# Patient Record
Sex: Female | Born: 1960 | Race: Black or African American | Hispanic: No | Smoking: Never smoker
Health system: Southern US, Community
[De-identification: ages and names within clinical notes are randomized; demographics above are authoritative.]

## PROBLEM LIST (undated history)

## (undated) DIAGNOSIS — I10 Essential (primary) hypertension: Secondary | ICD-10-CM

## (undated) DIAGNOSIS — R7303 Prediabetes: Secondary | ICD-10-CM

## (undated) HISTORY — PX: LAPAROSCOPIC TUBAL LIGATION: SUR803

## (undated) HISTORY — DX: Essential (primary) hypertension: I10

---

## 2018-12-19 ENCOUNTER — Encounter: Payer: Self-pay | Admitting: Radiology

## 2018-12-25 ENCOUNTER — Encounter: Payer: Self-pay | Admitting: Obstetrics & Gynecology

## 2018-12-25 ENCOUNTER — Ambulatory Visit (INDEPENDENT_AMBULATORY_CARE_PROVIDER_SITE_OTHER): Payer: BLUE CROSS/BLUE SHIELD | Admitting: Obstetrics & Gynecology

## 2018-12-25 ENCOUNTER — Encounter: Payer: Self-pay | Admitting: Radiology

## 2018-12-25 VITALS — BP 141/89 | HR 72 | Wt 168.2 lb

## 2018-12-25 DIAGNOSIS — Z1151 Encounter for screening for human papillomavirus (HPV): Secondary | ICD-10-CM | POA: Diagnosis not present

## 2018-12-25 DIAGNOSIS — Z01419 Encounter for gynecological examination (general) (routine) without abnormal findings: Secondary | ICD-10-CM | POA: Diagnosis not present

## 2018-12-25 DIAGNOSIS — Z124 Encounter for screening for malignant neoplasm of cervix: Secondary | ICD-10-CM | POA: Diagnosis not present

## 2018-12-25 DIAGNOSIS — Z1231 Encounter for screening mammogram for malignant neoplasm of breast: Secondary | ICD-10-CM

## 2018-12-25 NOTE — Progress Notes (Signed)
GYNECOLOGY ANNUAL PREVENTATIVE CARE ENCOUNTER NOTE  Subjective:   Caitlyn Shannon is a 58 y.o.  [phone] PMP female here for a routine annual gynecologic exam and to establish care.  Current complaints: none.   Denies abnormal vaginal bleeding, discharge, pelvic pain, problems with intercourse or other gynecologic concerns.    Gynecologic History No LMP recorded. Patient is postmenopausal. Contraception: post menopausal status Last Pap: 2017. Results were: normal with negative HPV Last mammogram: 2018. Results were: normal  Obstetric History OB History  Gravida Para Term Preterm AB Living  5 2 2   3 2   SAB TAB Ectopic Multiple Live Births  2 1          # Outcome Date GA Lbr Len/2nd Weight Sex Delivery Anes PTL Lv  5 TAB           4 SAB           3 SAB           2 Term      VBAC     1 Term      CS-LTranv       Past Medical History:  Diagnosis Date  . Hypertension     Past Surgical History:  Procedure Laterality Date  . CESAREAN SECTION    . LAPAROSCOPIC TUBAL LIGATION      Current Outpatient Medications on File Prior to Visit  Medication Sig Dispense Refill  . aspirin EC 81 MG tablet Take by mouth.    . spironolactone (ALDACTONE) 25 MG tablet Take by mouth.    . chlorthalidone (HYGROTON) 25 MG tablet Take by mouth.     No current facility-administered medications on file prior to visit.     Allergies  Allergen Reactions  . Sulfamethoxazole-Trimethoprim Rash    Social History:  reports that she has never smoked. She has never used smokeless tobacco. She reports current alcohol use. She reports that she does not use drugs.  History reviewed. No pertinent family history.  The following portions of the patient's history were reviewed and updated as appropriate: allergies, current medications, past family history, past medical history, past social history, past surgical history and problem list.  Review of Systems Pertinent items noted in HPI and remainder of  comprehensive ROS otherwise negative.   Objective:  BP (!) 141/89   Pulse 72   Wt 168 lb 3.2 oz (76.3 kg)  CONSTITUTIONAL: Well-developed, well-nourished female in no acute distress.  HENT:  Normocephalic, atraumatic, External right and left ear normal. Oropharynx is clear and moist EYES: Conjunctivae and EOM are normal. Pupils are equal, round, and reactive to light. No scleral icterus.  NECK: Normal range of motion, supple, no masses.  Normal thyroid.  SKIN: Skin is warm and dry. No rash noted. Not diaphoretic. No erythema. No pallor. MUSCULOSKELETAL: Normal range of motion. No tenderness.  No cyanosis, clubbing, or edema.  2+ distal pulses. NEUROLOGIC: Alert and oriented to person, place, and time. Normal reflexes, muscle tone coordination. No cranial nerve deficit noted. PSYCHIATRIC: Normal mood and affect. Normal behavior. Normal judgment and thought content. CARDIOVASCULAR: Normal heart rate noted, regular rhythm RESPIRATORY: Clear to auscultation bilaterally. Effort and breath sounds normal, no problems with respiration noted. BREASTS: Symmetric in size. No masses, skin changes, nipple drainage, or lymphadenopathy. ABDOMEN: Soft, normal bowel sounds, no distention noted.  No tenderness, rebound or guarding.  PELVIC: Normal appearing external genitalia; normal appearing vaginal mucosa and cervix.  No abnormal discharge noted.  Pap smear obtained.  Normal uterine size, no other palpable masses, no uterine or adnexal tenderness.    Assessment and Plan:  1. Breast cancer screening by mammogram Mammogram scheduled - MM 3D SCREEN BREAST BILATERAL; Future  2. Encounter for gynecological examination with Papanicolaou smear of cervix - Cytology - PAP( [place]) Will follow up results of pap smear and manage accordingly. Routine preventative health maintenance measures emphasized. Please refer to After Visit Summary for other counseling recommendations.    Jaynie Collins, MD,  FACOG Obstetrician & Gynecologist, [facility] for [employer], [facility]

## 2018-12-25 NOTE — Patient Instructions (Signed)
Preventive Care 40-64 Years, Female Preventive care refers to lifestyle choices and visits with your health care provider that can promote health and wellness. What does preventive care include?   A yearly physical exam. This is also called an annual well check.  Dental exams once or twice a year.  Routine eye exams. Ask your health care provider how often you should have your eyes checked.  Personal lifestyle choices, including: ? Daily care of your teeth and gums. ? Regular physical activity. ? Eating a healthy diet. ? Avoiding tobacco and drug use. ? Limiting alcohol use. ? Practicing safe sex. ? Taking low-dose aspirin daily starting at age 50. ? Taking vitamin and mineral supplements as recommended by your health care provider. What happens during an annual well check? The services and screenings done by your health care provider during your annual well check will depend on your age, overall health, lifestyle risk factors, and family history of disease. Counseling Your health care provider may ask you questions about your:  Alcohol use.  Tobacco use.  Drug use.  Emotional well-being.  Home and relationship well-being.  Sexual activity.  Eating habits.  Work and work environment.  Method of birth control.  Menstrual cycle.  Pregnancy history. Screening You may have the following tests or measurements:  Height, weight, and BMI.  Blood pressure.  Lipid and cholesterol levels. These may be checked every 5 years, or more frequently if you are over 50 years old.  Skin check.  Lung cancer screening. You may have this screening every year starting at age 55 if you have a 30-pack-year history of smoking and currently smoke or have quit within the past 15 years.  Colorectal cancer screening. All adults should have this screening starting at age 50 and continuing until age 75. Your health care provider may recommend screening at age 45. You will have tests every  1-10 years, depending on your results and the type of screening test. People at increased risk should start screening at an earlier age. Screening tests may include: ? Guaiac-based fecal occult blood testing. ? Fecal immunochemical test (FIT). ? Stool DNA test. ? Virtual colonoscopy. ? Sigmoidoscopy. During this test, a flexible tube with a tiny camera (sigmoidoscope) is used to examine your rectum and lower colon. The sigmoidoscope is inserted through your anus into your rectum and lower colon. ? Colonoscopy. During this test, a long, thin, flexible tube with a tiny camera (colonoscope) is used to examine your entire colon and rectum.  Hepatitis C blood test.  Hepatitis B blood test.  Sexually transmitted disease (STD) testing.  Diabetes screening. This is done by checking your blood sugar (glucose) after you have not eaten for a while (fasting). You may have this done every 1-3 years.  Mammogram. This may be done every 1-2 years. Talk to your health care provider about when you should start having regular mammograms. This may depend on whether you have a family history of breast cancer.  BRCA-related cancer screening. This may be done if you have a family history of breast, ovarian, tubal, or peritoneal cancers.  Pelvic exam and Pap test. This may be done every 3 years starting at age 21. Starting at age 30, this may be done every 5 years if you have a Pap test in combination with an HPV test.  Bone density scan. This is done to screen for osteoporosis. You may have this scan if you are at high risk for osteoporosis. Discuss your test results, treatment options,   and if necessary, the need for more tests with your health care provider. Vaccines Your health care provider may recommend certain vaccines, such as:  Influenza vaccine. This is recommended every year.  Tetanus, diphtheria, and acellular pertussis (Tdap, Td) vaccine. You may need a Td booster every 10 years.  Varicella  vaccine. You may need this if you have not been vaccinated.  Zoster vaccine. You may need this after age 38.  Measles, mumps, and rubella (MMR) vaccine. You may need at least one dose of MMR if you were born in 1957 or later. You may also need a second dose.  Pneumococcal 13-valent conjugate (PCV13) vaccine. You may need this if you have certain conditions and were not previously vaccinated.  Pneumococcal polysaccharide (PPSV23) vaccine. You may need one or two doses if you smoke cigarettes or if you have certain conditions.  Meningococcal vaccine. You may need this if you have certain conditions.  Hepatitis A vaccine. You may need this if you have certain conditions or if you travel or work in places where you may be exposed to hepatitis A.  Hepatitis B vaccine. You may need this if you have certain conditions or if you travel or work in places where you may be exposed to hepatitis B.  Haemophilus influenzae type b (Hib) vaccine. You may need this if you have certain conditions. Talk to your health care provider about which screenings and vaccines you need and how often you need them. This information is not intended to replace advice given to you by your health care provider. Make sure you discuss any questions you have with your health care provider. Document Released: 12/09/2015 Document Revised: 01/02/2018 Document Reviewed: 09/13/2015 Elsevier Interactive Patient Education  2019 [employer].

## 2018-12-29 LAB — CYTOLOGY - PAP
Diagnosis: NEGATIVE
HPV: NOT DETECTED

## 2019-01-07 ENCOUNTER — Ambulatory Visit
Admission: RE | Admit: 2019-01-07 | Discharge: 2019-01-07 | Disposition: A | Discharge status: Home / Self Care | Payer: BLUE CROSS/BLUE SHIELD | Source: Ambulatory Visit | Attending: Obstetrics & Gynecology | Admitting: Obstetrics & Gynecology

## 2019-01-07 DIAGNOSIS — Z1231 Encounter for screening mammogram for malignant neoplasm of breast: Secondary | ICD-10-CM | POA: Diagnosis not present

## 2019-07-31 IMAGING — MG DIGITAL SCREENING BILATERAL MAMMOGRAM WITH TOMO AND CAD
8 series · 8 of 24 positions shown · non-contrast
Comparison: Previous exam(s).

CLINICAL DATA: Screening.

EXAM:
DIGITAL SCREENING BILATERAL MAMMOGRAM WITH TOMO AND CAD

[L CC synth-2D]
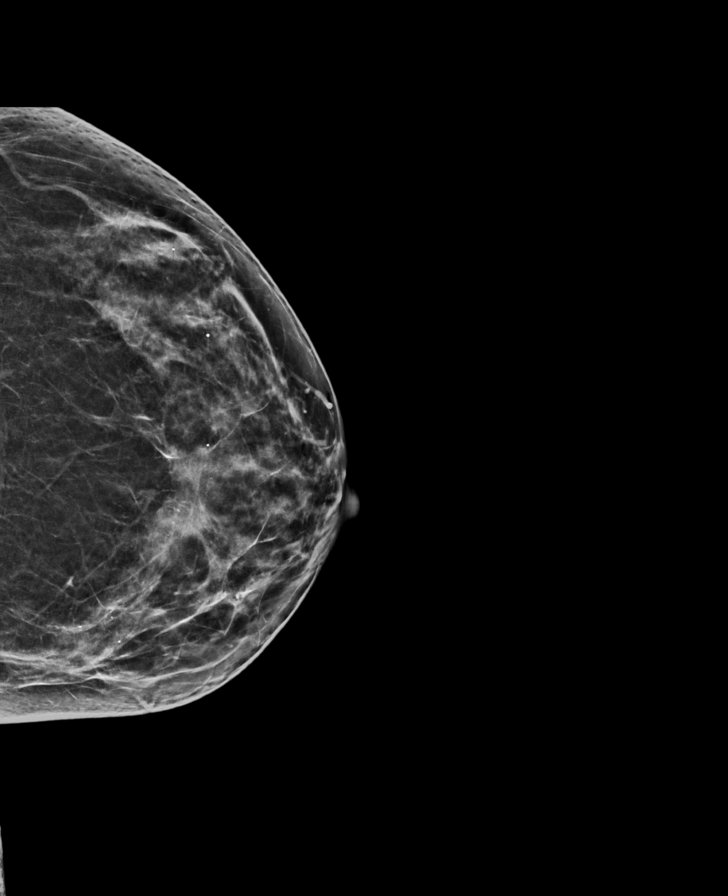

[R CC synth-2D]
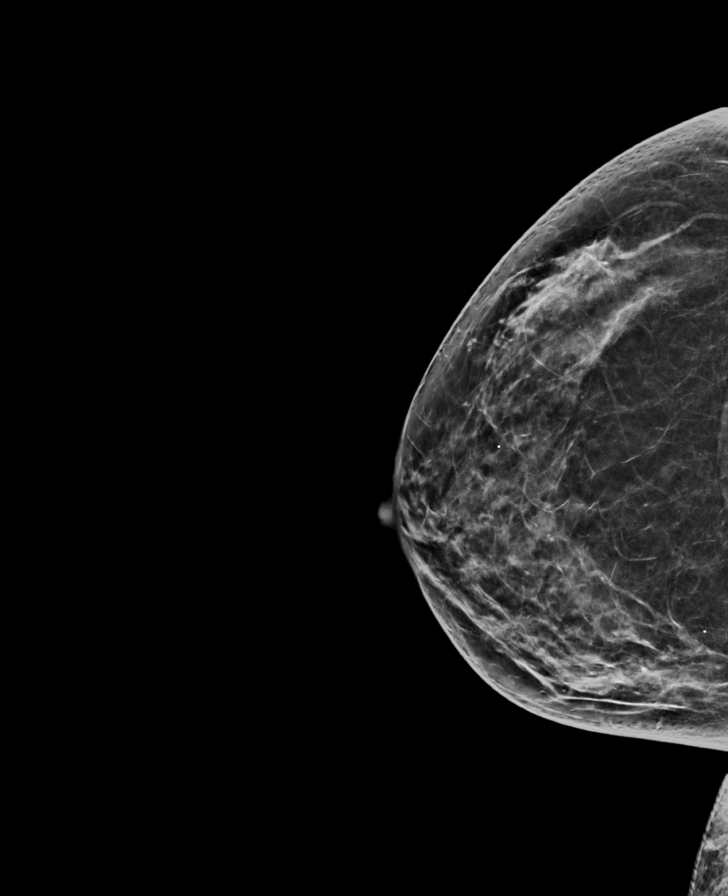

[R MLO synth-2D]
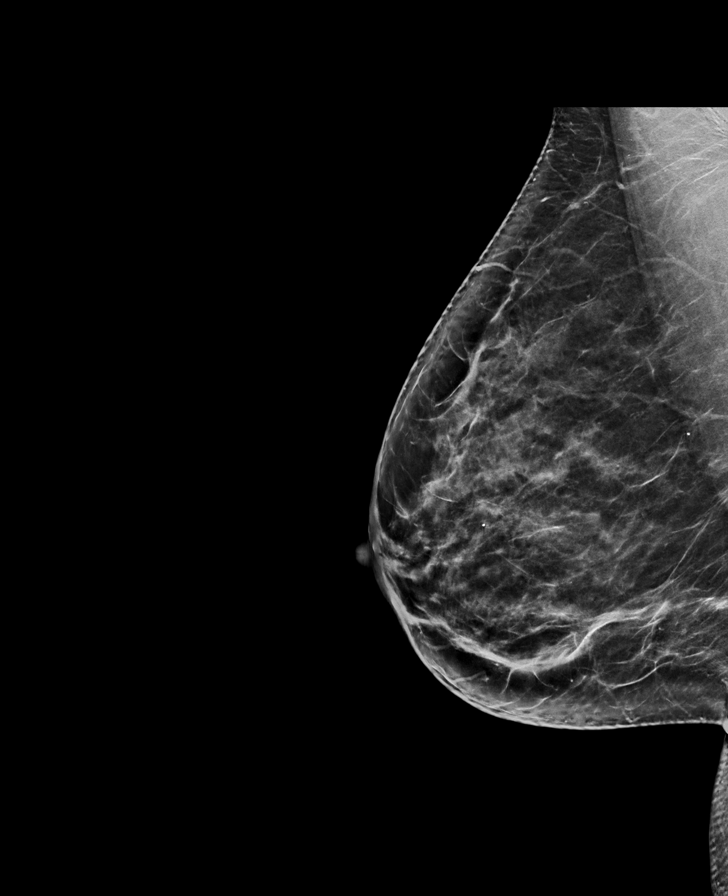

[L MLO synth-2D]
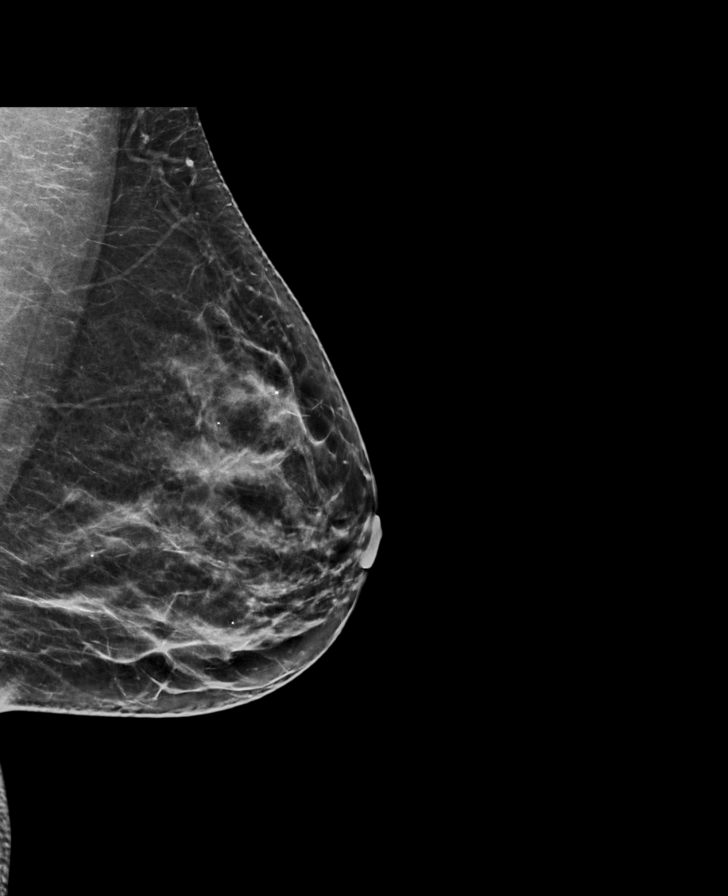

[R MLO tomo · tomo slice 37/72.0]
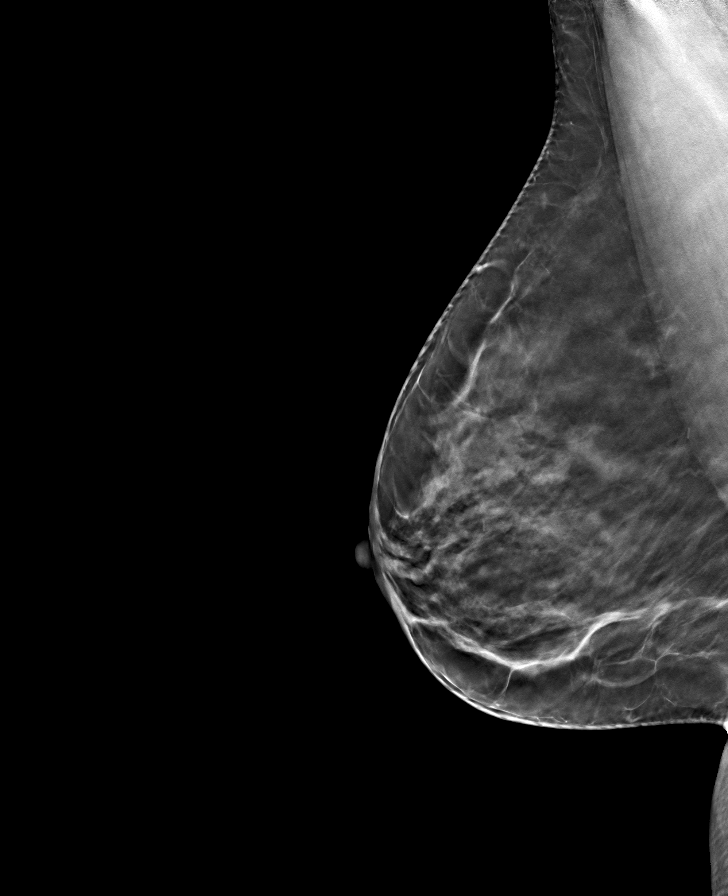

[L MLO tomo · tomo slice 37/73.0]
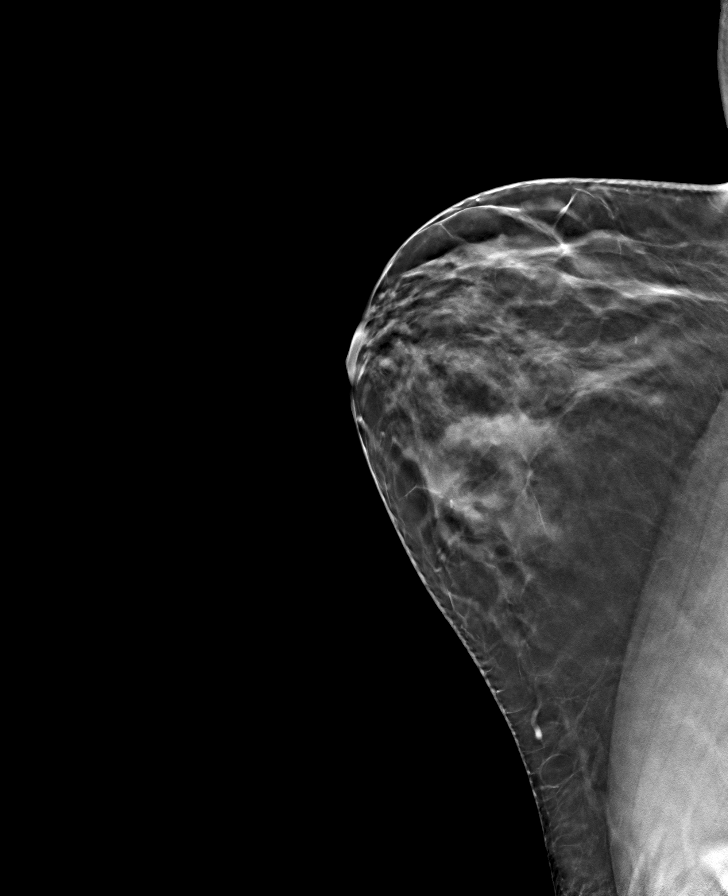

[L CC tomo · tomo slice 33/66.0]
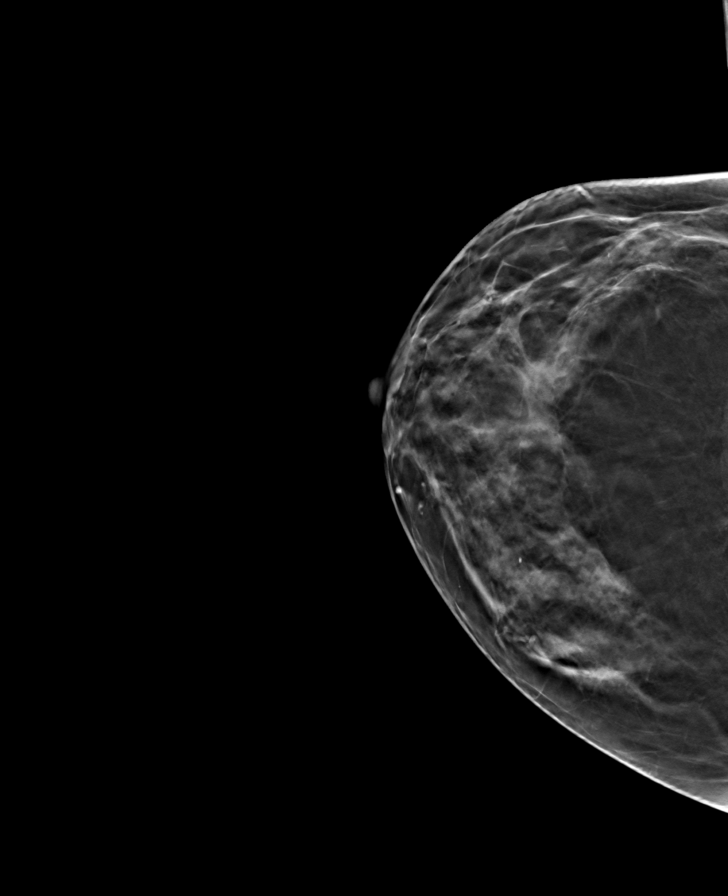

[R CC tomo · tomo slice 33/66.0]
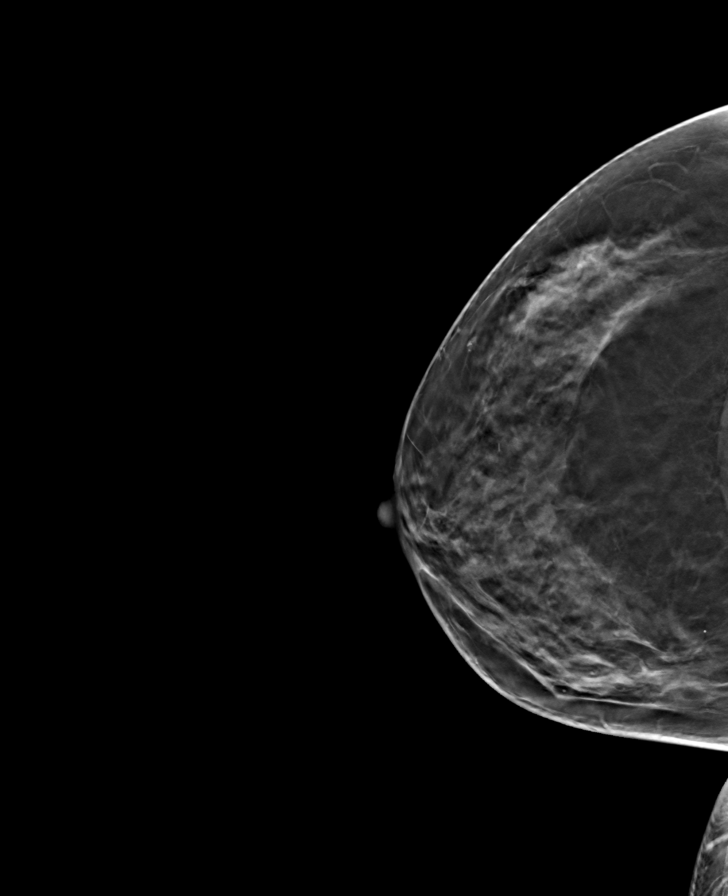

[8 of 24 positions shown; findings below may reference images not displayed]

ACR Breast Density Category c: The breast tissue is heterogeneously
dense, which may obscure small masses.
FINDINGS: There are no findings suspicious for malignancy. Images were
processed with CAD.
IMPRESSION: No mammographic evidence of malignancy. A result letter of this
screening mammogram will be mailed directly to the patient.

RECOMMENDATION:
Screening mammogram in one year. (Code:[id number])

BI-RADS CATEGORY  1: Negative.

## 2019-11-23 ENCOUNTER — Encounter: Payer: Self-pay | Admitting: Radiology

## 2020-01-27 ENCOUNTER — Other Ambulatory Visit: Payer: Self-pay | Admitting: Family Medicine

## 2020-01-27 DIAGNOSIS — Z1231 Encounter for screening mammogram for malignant neoplasm of breast: Secondary | ICD-10-CM

## 2020-02-17 ENCOUNTER — Ambulatory Visit
Admission: RE | Admit: 2020-02-17 | Discharge: 2020-02-17 | Disposition: A | Discharge status: Home / Self Care | Payer: BC Managed Care – PPO | Source: Ambulatory Visit | Attending: Family Medicine | Admitting: Family Medicine

## 2020-02-17 DIAGNOSIS — Z1231 Encounter for screening mammogram for malignant neoplasm of breast: Secondary | ICD-10-CM | POA: Insufficient documentation

## 2021-03-06 ENCOUNTER — Other Ambulatory Visit: Payer: Self-pay | Admitting: Family Medicine

## 2021-03-06 DIAGNOSIS — Z1231 Encounter for screening mammogram for malignant neoplasm of breast: Secondary | ICD-10-CM

## 2021-03-20 ENCOUNTER — Ambulatory Visit
Admission: RE | Admit: 2021-03-20 | Discharge: 2021-03-20 | Disposition: A | Discharge status: Home / Self Care | Payer: BC Managed Care – PPO | Source: Ambulatory Visit | Attending: Family Medicine | Admitting: Family Medicine

## 2021-03-20 ENCOUNTER — Other Ambulatory Visit: Payer: Self-pay

## 2021-03-20 DIAGNOSIS — Z1231 Encounter for screening mammogram for malignant neoplasm of breast: Secondary | ICD-10-CM | POA: Insufficient documentation

## 2022-03-09 ENCOUNTER — Other Ambulatory Visit: Payer: Self-pay | Admitting: Family Medicine

## 2022-03-09 DIAGNOSIS — Z1231 Encounter for screening mammogram for malignant neoplasm of breast: Secondary | ICD-10-CM

## 2022-04-13 ENCOUNTER — Ambulatory Visit
Admission: RE | Admit: 2022-04-13 | Discharge: 2022-04-13 | Disposition: A | Discharge status: Home / Self Care | Payer: BC Managed Care – PPO | Source: Ambulatory Visit | Attending: Family Medicine | Admitting: Family Medicine

## 2022-04-13 DIAGNOSIS — Z1231 Encounter for screening mammogram for malignant neoplasm of breast: Secondary | ICD-10-CM | POA: Insufficient documentation

## 2022-04-25 ENCOUNTER — Other Ambulatory Visit: Payer: Self-pay | Admitting: Family Medicine

## 2022-04-25 DIAGNOSIS — R1030 Lower abdominal pain, unspecified: Secondary | ICD-10-CM

## 2022-04-25 DIAGNOSIS — R109 Unspecified abdominal pain: Secondary | ICD-10-CM

## 2022-05-03 ENCOUNTER — Ambulatory Visit: Payer: BC Managed Care – PPO

## 2023-04-01 ENCOUNTER — Other Ambulatory Visit: Payer: Self-pay | Admitting: Family Medicine

## 2023-04-01 DIAGNOSIS — Z1231 Encounter for screening mammogram for malignant neoplasm of breast: Secondary | ICD-10-CM

## 2023-04-16 ENCOUNTER — Other Ambulatory Visit: Payer: Self-pay | Admitting: Family Medicine

## 2023-04-16 DIAGNOSIS — N83201 Unspecified ovarian cyst, right side: Secondary | ICD-10-CM

## 2023-04-19 ENCOUNTER — Ambulatory Visit: Payer: BC Managed Care – PPO

## 2023-04-23 ENCOUNTER — Ambulatory Visit
Admission: RE | Admit: 2023-04-23 | Discharge: 2023-04-23 | Disposition: A | Discharge status: Home / Self Care | Payer: BC Managed Care – PPO | Source: Ambulatory Visit | Attending: Family Medicine | Admitting: Family Medicine

## 2023-04-23 DIAGNOSIS — Z1231 Encounter for screening mammogram for malignant neoplasm of breast: Secondary | ICD-10-CM | POA: Diagnosis present

## 2024-04-30 ENCOUNTER — Other Ambulatory Visit: Payer: Self-pay | Admitting: Family Medicine

## 2024-04-30 DIAGNOSIS — Z1231 Encounter for screening mammogram for malignant neoplasm of breast: Secondary | ICD-10-CM

## 2024-05-13 ENCOUNTER — Ambulatory Visit
Admission: RE | Admit: 2024-05-13 | Discharge: 2024-05-13 | Disposition: A | Discharge status: Home / Self Care | Source: Ambulatory Visit | Attending: Family Medicine | Admitting: Family Medicine

## 2024-05-13 DIAGNOSIS — Z1231 Encounter for screening mammogram for malignant neoplasm of breast: Secondary | ICD-10-CM | POA: Insufficient documentation

## 2024-10-01 ENCOUNTER — Other Ambulatory Visit: Payer: Self-pay | Admitting: Podiatry

## 2024-10-12 ENCOUNTER — Encounter: Payer: Self-pay | Admitting: Podiatry

## 2024-10-12 NOTE — Anesthesia Preprocedure Evaluation (Signed)
 Anesthesia Evaluation  Patient identified by MRN, date of birth, ID band Patient awake    Reviewed: Allergy & Precautions, H&P , NPO status , Patient's Chart, lab work & pertinent test results  Airway Mallampati: III       Dental  (+) Caps Permanent bridge, caps:   Pulmonary neg pulmonary ROS          Cardiovascular hypertension, negative cardio ROS      Neuro/Psych negative neurological ROS  negative psych ROS   GI/Hepatic negative GI ROS, Neg liver ROS,,,  Endo/Other  negative endocrine ROS    Renal/GU negative Renal ROS  negative genitourinary   Musculoskeletal negative musculoskeletal ROS (+)    Abdominal   Peds negative pediatric ROS (+)  Hematology negative hematology ROS (+)   Anesthesia Other Findings HTN Pre-diabetes   Reproductive/Obstetrics negative OB ROS                              Anesthesia Physical Anesthesia Plan  ASA: 2  Anesthesia Plan: General   Post-op Pain Management:    Induction: Intravenous  PONV Risk Score and Plan:   Airway Management Planned: Natural Airway and Nasal Cannula  Additional Equipment:   Intra-op Plan:   Post-operative Plan: Extubation in OR  Informed Consent: I have reviewed the patients History and Physical, chart, labs and discussed the procedure including the risks, benefits and alternatives for the proposed anesthesia with the patient or authorized representative who has indicated his/her understanding and acceptance.     Dental Advisory Given  Plan Discussed with: Anesthesiologist, CRNA and Surgeon  Anesthesia Plan Comments: (Patient consented for risks of anesthesia including but not limited to:  - adverse reactions to medications - risk of airway placement if required - damage to eyes, teeth, lips or other oral mucosa - nerve damage due to positioning  - sore throat or hoarseness - Damage to heart, brain, nerves,  lungs, other parts of body or loss of life  Patient voiced understanding and assent.)        Anesthesia Quick Evaluation

## 2024-10-14 ENCOUNTER — Ambulatory Visit
Admission: RE | Admit: 2024-10-14 | Discharge: 2024-10-14 | Disposition: A | Discharge status: Home / Self Care | Attending: Podiatry | Admitting: Podiatry

## 2024-10-14 ENCOUNTER — Ambulatory Visit: Payer: Self-pay | Admitting: Anesthesiology

## 2024-10-14 ENCOUNTER — Ambulatory Visit: Payer: Self-pay

## 2024-10-14 ENCOUNTER — Other Ambulatory Visit: Payer: Self-pay

## 2024-10-14 ENCOUNTER — Encounter
Admission: RE | Disposition: A | Discharge status: Home / Self Care | Payer: Self-pay | Source: Home / Self Care | Attending: Podiatry

## 2024-10-14 ENCOUNTER — Encounter: Payer: Self-pay | Admitting: Podiatry

## 2024-10-14 DIAGNOSIS — I10 Essential (primary) hypertension: Secondary | ICD-10-CM | POA: Insufficient documentation

## 2024-10-14 DIAGNOSIS — M2012 Hallux valgus (acquired), left foot: Secondary | ICD-10-CM | POA: Diagnosis present

## 2024-10-14 DIAGNOSIS — R7303 Prediabetes: Secondary | ICD-10-CM | POA: Diagnosis not present

## 2024-10-14 HISTORY — PX: HALLUX VALGUS AUSTIN: SHX6623

## 2024-10-14 HISTORY — DX: Prediabetes: R73.03

## 2024-10-14 SURGERY — CORRECTION, HALLUX VALGUS
Anesthesia: General | Site: Foot | Laterality: Left

## 2024-10-14 MED ORDER — PHENYLEPHRINE HCL (PRESSORS) 10 MG/ML IV SOLN
INTRAVENOUS | Status: DC | PRN
  Administered 2024-10-14 (×6): 80 ug via INTRAVENOUS

## 2024-10-14 MED ORDER — CEFAZOLIN SODIUM-DEXTROSE 2-4 GM/100ML-% IV SOLN
2.0000 g | INTRAVENOUS | Status: AC
  Administered 2024-10-14: 2 g via INTRAVENOUS

## 2024-10-14 MED ORDER — LIDOCAINE HCL (CARDIAC) PF 100 MG/5ML IV SOSY
PREFILLED_SYRINGE | INTRAVENOUS | Status: DC | PRN
  Administered 2024-10-14: 60 mg via INTRATRACHEAL

## 2024-10-14 MED ORDER — FENTANYL CITRATE (PF) 100 MCG/2ML IJ SOLN
INTRAMUSCULAR | Status: DC | PRN
  Administered 2024-10-14: 25 ug via INTRAVENOUS

## 2024-10-14 MED ORDER — PROPOFOL 10 MG/ML IV BOLUS
INTRAVENOUS | Status: DC | PRN
  Administered 2024-10-14: 150 mg via INTRAVENOUS

## 2024-10-14 MED ORDER — SODIUM CHLORIDE 0.9 % IR SOLN
Status: DC | PRN
  Administered 2024-10-14: 500 mL

## 2024-10-14 MED ORDER — BUPIVACAINE HCL 0.25 % IJ SOLN
INTRAMUSCULAR | Status: AC
  Filled 2024-10-14: qty 1

## 2024-10-14 MED ORDER — CEFAZOLIN SODIUM-DEXTROSE 2-3 GM-%(50ML) IV SOLR
INTRAVENOUS | Status: AC
  Filled 2024-10-14: qty 50

## 2024-10-14 MED ORDER — BUPIVACAINE LIPOSOME 1.3 % IJ SUSP
INTRAMUSCULAR | Status: AC
  Filled 2024-10-14: qty 10

## 2024-10-14 MED ORDER — ONDANSETRON HCL 4 MG/2ML IJ SOLN
INTRAMUSCULAR | Status: DC | PRN
  Administered 2024-10-14: 4 mg via INTRAVENOUS

## 2024-10-14 MED ORDER — OXYCODONE-ACETAMINOPHEN 5-325 MG PO TABS: 1.0000 | ORAL_TABLET | Freq: Four times a day (QID) | ORAL | 0 refills | Status: AC | PRN

## 2024-10-14 MED ORDER — MIDAZOLAM HCL 5 MG/5ML IJ SOLN
INTRAMUSCULAR | Status: DC | PRN
Start: 2024-10-14 — End: 2024-10-14
  Administered 2024-10-14: 2 mg via INTRAVENOUS

## 2024-10-14 MED ORDER — FENTANYL CITRATE (PF) 100 MCG/2ML IJ SOLN
INTRAMUSCULAR | Status: AC
  Filled 2024-10-14: qty 2

## 2024-10-14 MED ORDER — KETOROLAC TROMETHAMINE 30 MG/ML IJ SOLN
INTRAMUSCULAR | Status: DC | PRN
  Administered 2024-10-14: 30 mg via INTRAVENOUS

## 2024-10-14 MED ORDER — BUPIVACAINE HCL (PF) 0.25 % IJ SOLN
INTRAMUSCULAR | Status: DC | PRN
  Administered 2024-10-14: 10 mL

## 2024-10-14 MED ORDER — BUPIVACAINE LIPOSOME 1.3 % IJ SUSP
INTRAMUSCULAR | Status: DC | PRN
  Administered 2024-10-14: 10 mL

## 2024-10-14 MED ORDER — MIDAZOLAM HCL 2 MG/2ML IJ SOLN
INTRAMUSCULAR | Status: AC
  Filled 2024-10-14: qty 2

## 2024-10-14 MED ORDER — LACTATED RINGERS IV SOLN: INTRAVENOUS | Status: DC

## 2024-10-14 MED ORDER — SODIUM CHLORIDE 0.9 % IV SOLN: INTRAVENOUS | Status: DC

## 2024-10-14 SURGICAL SUPPLY — 34 items
BENZOIN TINCTURE PRP APPL 2/3 (GAUZE/BANDAGES/DRESSINGS) ×1 IMPLANT
BIT DRILL 2.1 LONG CANN (MISCELLANEOUS) IMPLANT
BLADE MED AGGRESSIVE (BLADE) IMPLANT
BNDG COHESIVE 4X5 TAN STRL LF (GAUZE/BANDAGES/DRESSINGS) ×1 IMPLANT
BNDG ELASTIC 4X5.8 VLCR NS LF (GAUZE/BANDAGES/DRESSINGS) ×1 IMPLANT
BNDG ESMARCH 4X12 STRL LF (GAUZE/BANDAGES/DRESSINGS) ×1 IMPLANT
BNDG GAUZE DERMACEA FLUFF 4 (GAUZE/BANDAGES/DRESSINGS) ×1 IMPLANT
BNDG STRETCH 4X75 STRL LF (GAUZE/BANDAGES/DRESSINGS) ×1 IMPLANT
BOOT WALKER MEDIUM (SOFTGOODS) IMPLANT
CANISTER SUCT 1200ML W/VALVE (MISCELLANEOUS) ×1 IMPLANT
COVER LIGHT HANDLE UNIVERSAL (MISCELLANEOUS) ×2 IMPLANT
DRAPE FLUOR MINI C-ARM 54X84 (DRAPES) ×1 IMPLANT
DRSG TEGADERM 4X4.75 (GAUZE/BANDAGES/DRESSINGS) IMPLANT
DURAPREP 26ML APPLICATOR (WOUND CARE) ×1 IMPLANT
ELECTRODE REM PT RTRN 9FT ADLT (ELECTROSURGICAL) ×1 IMPLANT
GAUZE SPONGE 4X4 12PLY STRL (GAUZE/BANDAGES/DRESSINGS) ×1 IMPLANT
GAUZE XEROFORM 1X8 LF (GAUZE/BANDAGES/DRESSINGS) ×1 IMPLANT
GLOVE BIOGEL PI IND STRL 8 (GLOVE) ×2 IMPLANT
GLOVE SURG SS PI 7.5 STRL IVOR (GLOVE) ×2 IMPLANT
GOWN SPEC L4 XLG W/TWL (GOWN DISPOSABLE) ×1 IMPLANT
GOWN STRL REUS W/ TWL LRG LVL3 (GOWN DISPOSABLE) ×1 IMPLANT
KIT TURNOVER KIT A (KITS) ×1 IMPLANT
KWIRE SINGLE SMOOTH 1.1X150MM (WIRE) IMPLANT
NS IRRIG 500ML POUR BTL (IV SOLUTION) ×1 IMPLANT
PACK EXTREMITY ARMC (MISCELLANEOUS) ×1 IMPLANT
PENCIL SMOKE EVACUATOR (MISCELLANEOUS) ×1 IMPLANT
RASP SM TEAR CROSS CUT (RASP) IMPLANT
SCREW CANN HEAD ST 3.0X20 (Screw) IMPLANT
SCREW HEADED COUNTER SINK 3.0 IMPLANT
STOCKINETTE IMPERVIOUS LG (DRAPES) ×1 IMPLANT
STRIP CLOSURE SKIN 1/4X4 (GAUZE/BANDAGES/DRESSINGS) ×1 IMPLANT
SUT VIC AB 3-0 SH 27X BRD (SUTURE) IMPLANT
SUT VIC AB 4-0 SH 27XANBCTRL (SUTURE) IMPLANT
SUTURE MNCRL 4-0 27XMF (SUTURE) ×1 IMPLANT

## 2024-10-14 NOTE — Transfer of Care (Signed)
 Immediate Anesthesia Transfer of Care Note  Patient: Caitlyn Shannon  Procedure(s) Performed: CORRECTION, HALLUX VALGUS (Left: Foot)  Patient Location: PACU  Anesthesia Type: General  Level of Consciousness: awake, alert  and patient cooperative  Airway and Oxygen Therapy: Patient Spontanous Breathing and Patient connected to supplemental oxygen  Post-op Assessment: Post-op Vital signs reviewed, Patient's Cardiovascular Status Stable, Respiratory Function Stable, Patent Airway and No signs of Nausea or vomiting  Post-op Vital Signs: Reviewed and stable  Complications: No notable events documented.

## 2024-10-14 NOTE — Discharge Instructions (Signed)

## 2024-10-14 NOTE — H&P (Signed)
 HISTORY AND PHYSICAL INTERVAL NOTE:  10/14/2024  11:51 AM  Caitlyn Shannon  has presented today for surgery, with the diagnosis of Hallux valgus of left foot M20.12.  The various methods of treatment have been discussed with the patient.  No guarantees were given.  After consideration of risks, benefits and other options for treatment, the patient has consented to surgery.  I have reviewed the patients' chart and labs.     A history and physical examination was performed in my office.  The patient was reexamined.  There have been no changes to this history and physical examination.  Ashley Soulier A

## 2024-10-14 NOTE — Anesthesia Postprocedure Evaluation (Signed)
 Anesthesia Post Note  Patient: Caitlyn Shannon  Procedure(s) Performed: CORRECTION, HALLUX VALGUS (Left: Foot)  Patient location during evaluation: PACU Anesthesia Type: General Level of consciousness: awake and alert Pain management: pain level controlled Vital Signs Assessment: post-procedure vital signs reviewed and stable Respiratory status: spontaneous breathing, nonlabored ventilation, respiratory function stable and patient connected to nasal cannula oxygen Cardiovascular status: blood pressure returned to baseline and stable Postop Assessment: no apparent nausea or vomiting Anesthetic complications: no   No notable events documented.   Last Vitals:  Vitals:   10/14/24 1330 10/14/24 1336  BP: 120/84 132/80  Pulse: 81 73  Resp: 12 11  Temp:  36.7 C  SpO2: 100% 100%    Last Pain:  Vitals:   10/14/24 1336  TempSrc:   PainSc: 0-No pain                 Holdan Stucke C Aubery Douthat

## 2024-10-14 NOTE — Anesthesia Procedure Notes (Addendum)
 Procedure Name: LMA Insertion Date/Time: 10/14/2024 12:18 PM  Performed by: Levy Harvey, CRNAPre-anesthesia Checklist: Patient identified, Emergency Drugs available, Suction available, Timeout performed and Patient being monitored Patient Re-evaluated:Patient Re-evaluated prior to induction Oxygen Delivery Method: Circle system utilized Preoxygenation: Pre-oxygenation with 100% oxygen Induction Type: IV induction LMA: LMA inserted LMA Size: 4.0 Number of attempts: 1 Placement Confirmation: positive ETCO2 and breath sounds checked- equal and bilateral Tube secured with: Tape

## 2024-10-14 NOTE — Op Note (Signed)
 Operative note   Surgeon:Aveon Colquhoun [employer]/support/administrative Officer: None    Preop diagnosis: Left foot hallux valgus    Postop diagnosis: Same    Procedure: 1.  [place] hallux valgus correction left foot 2.  Intraoperative fluoroscopy without assistance of radiologist    EBL: Minimal    Anesthesia: General With local.  Local consists of a total of 20 cc of a one-to-one mixture of 0.25% plain bupivacaine and Exparel long-acting anesthetic    Hemostasis: Mid calf tourniquet inflated to 200 mmHg for 39 minutes    Specimen: None    Complications: None    Operative indications:Caitlyn Shannon is an 63 y.o. that presents today for surgical intervention.  The risks/benefits/alternatives/complications have been discussed and consent has been given.    Procedure:  Patient was brought into the OR and placed on the operating table in thesupine position. After anesthesia was obtained theleft lower extremity was prepped and draped in usual sterile fashion.  Attention was directed to the dorsal medial left first MTPJ where incision was performed.  Sharp and blunt dissection carried down to the capsule.  The intermetatarsal space was entered and the DTI L was transected.  The conjoined tendon of the abductor was noted and transected.  Good realignment of the first MTPJ was noted.  A T capsulotomy was placed medial and the dorsomedial eminence was noted and transected.  Next of the osteotomy was created with the apex distal.  The capital fragment was translocated laterally and stabilized.  Next a 3.0 mm cannulated headed screw was then applied with good compression and stability to the first metatarsal.  Good realignment was noted in all planes.  At this time the ensuing overhanging ledge was transected with a power saw and all areas were smoothed with a power rasp.  Good realignment was noted in all planes on fluoroscopy.  Further remodeling of the dorsomedial eminence was performed with a power rasp.  The wound was  flushed with copious amounts of irrigation.  Closure was then performed with a 3-0 Vicryl the capsule with a small capsulorrhaphy performed.  4-0 Vicryl the subcutaneous tissue and a 4-0 Monocryl for the skin.  A bulky sterile dressing was applied and patient was placed in an equalizer walker boot for weightbearing as tolerated.    Patient tolerated the procedure and anesthesia well.  Was transported from the OR to the PACU with all vital signs stable and vascular status intact. To be discharged per routine protocol.  Will follow up in approximately 1 week in the outpatient clinic.

## 2024-10-15 ENCOUNTER — Encounter: Payer: Self-pay | Admitting: Podiatry
